# Patient Record
Sex: Male | Born: 1995 | Race: Black or African American | Hispanic: No | Marital: Single | Smoking: Former smoker
Health system: Southern US, Community
[De-identification: ages and names within clinical notes are randomized; demographics above are authoritative.]

---

## 2015-11-18 ENCOUNTER — Encounter (HOSPITAL_COMMUNITY): Payer: Self-pay

## 2015-11-18 ENCOUNTER — Emergency Department (HOSPITAL_COMMUNITY)
Admission: EM | Admit: 2015-11-18 | Discharge: 2015-11-19 | Disposition: A | Discharge status: Home / Self Care | Payer: Managed Care, Other (non HMO) | Attending: Emergency Medicine | Admitting: Emergency Medicine

## 2015-11-18 DIAGNOSIS — R42 Dizziness and giddiness: Secondary | ICD-10-CM | POA: Diagnosis not present

## 2015-11-18 DIAGNOSIS — R112 Nausea with vomiting, unspecified: Secondary | ICD-10-CM

## 2015-11-18 DIAGNOSIS — R52 Pain, unspecified: Secondary | ICD-10-CM

## 2015-11-18 DIAGNOSIS — R109 Unspecified abdominal pain: Secondary | ICD-10-CM | POA: Diagnosis not present

## 2015-11-18 MED ORDER — ONDANSETRON 4 MG PO TBDP
4.0000 mg | ORAL_TABLET | Freq: Once | ORAL | Status: AC | PRN
  Administered 2015-11-18: 4 mg via ORAL
  Filled 2015-11-18: qty 1

## 2015-11-18 NOTE — ED Notes (Signed)
Pt complains of vomiting and feeling lightheaded after eating chinese food today

## 2015-11-18 NOTE — ED Notes (Signed)
Pt states his nausea has improved but worsens when he drinks fluids. Pt has not had an episode of vomiting as of yet.

## 2015-11-18 NOTE — ED Provider Notes (Signed)
History  By signing my name below, I, Karle Plumber, attest that this documentation has been prepared under the direction and in the presence of Elpidio Anis, PA-C. Electronically Signed: Karle Plumber, ED Scribe. 11/18/2015. 11:21 PM.  Chief Complaint  Patient presents with  . Emesis   The history is provided by the patient and medical records. No language interpreter was used.    HPI Comments:  Corey Elliott is a 20 y.o. male who presents to the Emergency Department complaining of nausea that began about 7 hours ago and one episode of vomiting with dark red blood in it that occurred about one hour ago approximately 8 hours after eating [place] food. He reports associated sharp, mid left-sided epigastric abdominal pain. He rates his abdominal pain at 8/10. He reports associated light-headedness and dizziness. He has not taken anything to treat his symptoms. Walking around makes him feel worse. He denies alleviating factors. He denies fever, CP, SOB, diarrhea. Pt denies h/o abdominal surgeries. He denies allergies to any medications. He reports PMHx of GERD.  History reviewed. No pertinent past medical history. History reviewed. No pertinent past surgical history. History reviewed. No pertinent family history. Social History  Substance Use Topics  . Smoking status: Never Smoker   . Smokeless tobacco: None  . Alcohol Use: Yes    Review of Systems  Constitutional: Negative for fever.  Respiratory: Negative for shortness of breath.   Cardiovascular: Negative for chest pain.  Gastrointestinal: Positive for nausea, vomiting and abdominal pain.  Musculoskeletal: Negative for back pain.  Neurological: Positive for dizziness and light-headedness. Negative for syncope.  All other systems reviewed and are negative.   Allergies  Review of patient's allergies indicates no known allergies.  Home Medications   Prior to Admission medications   Not on File   Triage Vitals: BP 142/87  mmHg  Pulse 84  Temp(Src) 98.3 F (36.8 C) (Oral)  Resp 18  Ht  (1.702 m)  Wt 165 lb (74.844 kg)  BMI 25.84 kg/m2  SpO2 100% Physical Exam  Constitutional: He is oriented to person, place, and time. He appears well-developed and well-nourished.  HENT:  Head: Normocephalic and atraumatic.  Eyes: EOM are normal.  Neck: Normal range of motion.  Cardiovascular: Normal rate.   Pulmonary/Chest: Effort normal.  Abdominal: Soft. There is tenderness. There is no rebound.    Musculoskeletal: Normal range of motion.  Neurological: He is alert and oriented to person, place, and time.  Skin: Skin is warm and dry.  Psychiatric: He has a normal mood and affect. His behavior is normal.  Nursing note and vitals reviewed.   ED Course  Procedures (including critical care time) DIAGNOSTIC STUDIES: Oxygen Saturation is 100% on RA, normal by my interpretation.   COORDINATION OF CARE: 11:18 PM- Will order Zofran. Pt verbalizes understanding and agrees to plan.  Medications  ondansetron (ZOFRAN-ODT) disintegrating tablet 4 mg (4 mg Oral Given 11/18/15 2308)   Labs Review Labs Reviewed - No data to display  Imaging Review No results found. I have personally reviewed and evaluated these images and lab results as part of my medical decision-making.   EKG Interpretation None      MDM   Final diagnoses:  None    1. Abdominal pain   12:45 - the patient continues to be significantly tender on left abdomen, with nausea. No further vomiting. Will do labs, CT abd/pel.   2:45 - CT and labs are unremarkable for acute finding. He has had no further vomiting.  He is tolerating PO fluids. He can be discharged home with symptomatic treatment - Rx Reglan - and PCP follow up   I personally performed the services described in this documentation, which was scribed in my presence. The recorded information has been reviewed and is accurate.     Elpidio Anis, PA-C 11/19/15 1610  Linwood Dibbles, MD 11/19/15 [phone]

## 2015-11-19 ENCOUNTER — Emergency Department (HOSPITAL_COMMUNITY): Payer: Managed Care, Other (non HMO)

## 2015-11-19 LAB — CBC WITH DIFFERENTIAL/PLATELET
Basophils Absolute: 0 10*3/uL (ref 0.0–0.1)
Basophils Relative: 1 %
Eosinophils Absolute: 0.1 10*3/uL (ref 0.0–0.7)
Eosinophils Relative: 1 %
HEMATOCRIT: 43 % (ref 39.0–52.0)
HEMOGLOBIN: 13.9 g/dL (ref 13.0–17.0)
LYMPHS ABS: 3.3 10*3/uL (ref 0.7–4.0)
LYMPHS PCT: 55 %
MCH: 30.5 pg (ref 26.0–34.0)
MCHC: 32.3 g/dL (ref 30.0–36.0)
MCV: 94.5 fL (ref 78.0–100.0)
MONO ABS: 0.6 10*3/uL (ref 0.1–1.0)
MONOS PCT: 10 %
NEUTROS ABS: 2 10*3/uL (ref 1.7–7.7)
NEUTROS PCT: 33 %
Platelets: 358 10*3/uL (ref 150–400)
RBC: 4.55 MIL/uL (ref 4.22–5.81)
RDW: 13 % (ref 11.5–15.5)
WBC: 5.9 10*3/uL (ref 4.0–10.5)

## 2015-11-19 LAB — COMPREHENSIVE METABOLIC PANEL
ALBUMIN: 4.7 g/dL (ref 3.5–5.0)
ALT: 33 U/L (ref 17–63)
ANION GAP: 8 (ref 5–15)
AST: 38 U/L (ref 15–41)
Alkaline Phosphatase: 50 U/L (ref 38–126)
BILIRUBIN TOTAL: 0.8 mg/dL (ref 0.3–1.2)
BUN: 13 mg/dL (ref 6–20)
CO2: 26 mmol/L (ref 22–32)
Calcium: 9.5 mg/dL (ref 8.9–10.3)
Chloride: 105 mmol/L (ref 101–111)
Creatinine, Ser: 0.84 mg/dL (ref 0.61–1.24)
GFR calc Af Amer: >60 mL/min (ref >60)
GFR calc non Af Amer: >60 mL/min (ref >60)
GLUCOSE: 112 mg/dL — AB (ref 65–99)
POTASSIUM: 4.2 mmol/L (ref 3.5–5.1)
SODIUM: 139 mmol/L (ref 135–145)
TOTAL PROTEIN: 7.6 g/dL (ref 6.5–8.1)

## 2015-11-19 LAB — LIPASE, BLOOD: Lipase: 28 U/L (ref 11–51)

## 2015-11-19 MED ORDER — METOCLOPRAMIDE HCL 10 MG PO TABS: 10.0000 mg | ORAL_TABLET | Freq: Four times a day (QID) | ORAL | Status: DC

## 2015-11-19 MED ORDER — METOCLOPRAMIDE HCL 5 MG/ML IJ SOLN
10.0000 mg | Freq: Once | INTRAMUSCULAR | Status: AC
  Administered 2015-11-19: 10 mg via INTRAVENOUS
  Filled 2015-11-19: qty 2

## 2015-11-19 MED ORDER — IOHEXOL 300 MG/ML  SOLN
50.0000 mL | Freq: Once | INTRAMUSCULAR | Status: AC | PRN
  Administered 2015-11-19: 50 mL via ORAL

## 2015-11-19 MED ORDER — IOHEXOL 300 MG/ML  SOLN
100.0000 mL | Freq: Once | INTRAMUSCULAR | Status: AC | PRN
  Administered 2015-11-19: 100 mL via INTRAVENOUS

## 2015-11-19 MED ORDER — SODIUM CHLORIDE 0.9 % IV BOLUS (SEPSIS)
1000.0000 mL | Freq: Once | INTRAVENOUS | Status: AC
  Administered 2015-11-19: 1000 mL via INTRAVENOUS

## 2015-11-19 NOTE — ED Notes (Signed)
Pt states nausea has resolved

## 2015-11-19 NOTE — Discharge Instructions (Signed)

## 2016-01-06 ENCOUNTER — Ambulatory Visit: Payer: Managed Care, Other (non HMO)

## 2016-01-07 ENCOUNTER — Encounter: Payer: Self-pay | Admitting: Internal Medicine

## 2016-01-07 ENCOUNTER — Ambulatory Visit (INDEPENDENT_AMBULATORY_CARE_PROVIDER_SITE_OTHER): Payer: Managed Care, Other (non HMO) | Admitting: Internal Medicine

## 2016-01-07 VITALS — BP 120/76 | HR 67 | Temp 97.6°F | Resp 12 | Ht 67.0 in | Wt 170.0 lb

## 2016-01-07 DIAGNOSIS — Z0000 Encounter for general adult medical examination without abnormal findings: Secondary | ICD-10-CM

## 2016-01-07 NOTE — Patient Instructions (Signed)
     IF you received an x-ray today, you will receive an invoice from [facility]. Please contact [facility] at [phone] with questions or concerns regarding your invoice.   IF you received labwork today, you will receive an invoice from [facility]. Please contact [facility] at [phone] with questions or concerns regarding your invoice.   Our billing staff will not be able to assist you with questions regarding bills from these companies.  You will be contacted with the lab results as soon as they are available. The fastest way to get your results is to activate your My Chart account. Instructions are located on the last page of this paperwork. If you have not heard from us regarding the results in 2 weeks, please contact this office.      

## 2016-01-07 NOTE — Progress Notes (Addendum)
Subjective:  By signing my name below, I, Stann Ore, attest that this documentation has been prepared under the direction and in the presence of Ellamae Sia, MD. Electronically Signed: Stann Ore, Scribe. 01/07/2016 , 12:08 PM .  Patient was seen in Room 14 .   Patient ID: Corey Elliott, male    DOB: 16-Mar-1996, 20 y.o.   MRN: [id number] Chief Complaint  Patient presents with  . Annual Exam   HPI!st OV at [facility] Corey Elliott is a 20 y.o. 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  He denies chronic illnesses. He exercises regularly. He denies allergies. He denies any major injuries from sports. He denies vision problems. He is a current smoker; he plans to quit soon. Limited ETOH.   Immunizations He received immunizations when he attended college when he was 16 (3 years ago).   He is on no meds  Active Ambulatory Problems    Diagnosis Date Noted  . No Active Ambulatory Problems   Resolved Ambulatory Problems    Diagnosis Date Noted  . No Resolved Ambulatory Problems   History reviewed. No pertinent past surgical history. Social History   Social History  . Marital Status: Single   Social History Main Topics  . Smoking status: Current Every Day Smoker -- 1.00 packs/day for 2 years    Types: Cigarettes  . Smokeless tobacco: Not on file  . Alcohol Use: 0.0 oz/week    0 Standard drinks or equivalent per week  . Drug Use: No    Review of Systems  Constitutional: Negative for fever, chills, activity change, appetite change and fatigue.  HENT: Negative for dental problem, hearing loss and trouble swallowing.   Eyes: Negative for photophobia, pain and visual disturbance.  Respiratory: Negative for cough, choking, shortness of breath and wheezing.   Cardiovascular: Negative for chest pain, palpitations and leg swelling.  Gastrointestinal: Negative for nausea, vomiting, abdominal pain, diarrhea and  constipation.  Genitourinary: Negative for urgency, frequency, difficulty urinating and testicular pain.  Musculoskeletal: Negative for myalgias, back pain, joint swelling, arthralgias, gait problem, neck pain and neck stiffness.  Skin: Negative for rash and wound.  Neurological: Negative for dizziness, speech difficulty, weakness, light-headedness and headaches.  Hematological: Negative for adenopathy. Does not bruise/bleed easily.  Psychiatric/Behavioral: Negative for behavioral problems, sleep disturbance, self-injury, dysphoric mood and decreased concentration. The patient is not nervous/anxious.       Objective:   Physical Exam  Constitutional: He is oriented to person, place, and time. He appears well-developed and well-nourished. No distress.  HENT:  Head: Normocephalic and atraumatic.  Right Ear: Hearing, tympanic membrane, external ear and ear canal normal.  Left Ear: Hearing, tympanic membrane, external ear and ear canal normal.  Nose: Nose normal.  Mouth/Throat: Uvula is midline, oropharynx is clear and moist and mucous membranes are normal.  Eyes: Conjunctivae, EOM and lids are normal. Pupils are equal, round, and reactive to light. Right eye exhibits no discharge. Left eye exhibits no discharge. No scleral icterus.  Neck: Trachea normal and normal range of motion. Neck supple. Carotid bruit is not present.  Cardiovascular: Normal rate, regular rhythm, normal heart sounds, intact distal pulses and normal pulses.   No murmur heard. Pulmonary/Chest: Effort normal and breath sounds normal. No respiratory distress. He has no wheezes. He has no rhonchi. He has no rales.  Abdominal: Soft. Normal appearance and bowel sounds are normal. He exhibits no abdominal bruit. There is no hepatosplenomegaly.  There is no tenderness. There is no CVA tenderness.  Musculoskeletal: Normal range of motion. He exhibits no edema or tenderness.  Lymphadenopathy:       Head (right side): No submental, no  submandibular, no tonsillar, no preauricular, no posterior auricular and no occipital adenopathy present.       Head (left side): No submental, no submandibular, no tonsillar, no preauricular, no posterior auricular and no occipital adenopathy present.    He has no cervical adenopathy.  Neurological: He is alert and oriented to person, place, and time. He has normal strength and normal reflexes. No cranial nerve deficit or sensory deficit. Coordination and gait normal.  Skin: Skin is warm, dry and intact. No lesion and no rash noted.  Psychiatric: He has a normal mood and affect. His speech is normal and behavior is normal. Judgment and thought content normal.  Nursing note and vitals reviewed.  BP 120/76 mmHg  Pulse 67  Temp(Src) 97.6 F (36.4 C) (Oral)  Resp 12  Ht 5\' 7"  (1.702 m)  Wt 170 lb (77.111 kg)  BMI 26.62 kg/m2  SpO2 99%    Last labs Results for orders placed or performed during the hospital encounter of 11/18/15  Comprehensive metabolic panel  Result Value Ref Range   Sodium 139 135 - 145 mmol/L   Potassium 4.2 3.5 - 5.1 mmol/L   Chloride 105 101 - 111 mmol/L   CO2 26 22 - 32 mmol/L   Glucose, Bld 112 (H) 65 - 99 mg/dL   BUN 13 6 - 20 mg/dL   Creatinine, Ser 1.610.84 0.61 - 1.24 mg/dL   Calcium 9.5 8.9 - 09.610.3 mg/dL   Total Protein 7.6 6.5 - 8.1 g/dL   Albumin 4.7 3.5 - 5.0 g/dL   AST 38 15 - 41 U/L   ALT 33 17 - 63 U/L   Alkaline Phosphatase 50 38 - 126 U/L   Total Bilirubin 0.8 0.3 - 1.2 mg/dL   GFR calc non Af Amer >60 >60 mL/min   GFR calc Af Amer >60 >60 mL/min   Anion gap 8 5 - 15  Lipase, blood  Result Value Ref Range   Lipase 28 11 - 51 U/L  CBC with Differential  Result Value Ref Range   WBC 5.9 4.0 - 10.5 K/uL   RBC 4.55 4.22 - 5.81 MIL/uL   Hemoglobin 13.9 13.0 - 17.0 g/dL   HCT 04.543.0 40.939.0 - 81.152.0 %   MCV 94.5 78.0 - 100.0 fL   MCH 30.5 26.0 - 34.0 pg   MCHC 32.3 30.0 - 36.0 g/dL   RDW 91.413.0 78.211.5 - 95.615.5 %   Platelets 358 150 - 400 K/uL   Neutrophils  Relative % 33 %   Neutro Abs 2.0 1.7 - 7.7 K/uL   Lymphocytes Relative 55 %   Lymphs Abs 3.3 0.7 - 4.0 K/uL   Monocytes Relative 10 %   Monocytes Absolute 0.6 0.1 - 1.0 K/uL   Eosinophils Relative 1 %   Eosinophils Absolute 0.1 0.0 - 0.7 K/uL   Basophils Relative 1 %   Basophils Absolute 0.0 0.0 - 0.1 K/uL    Assessment & Plan:  Annual Physical Exam-healthy  Immunizations reported as up to date  No need to recheck unless problems for next 2 years  I have completed the patient encounter in its entirety as documented by the scribe, with editing by me where necessary. Davanna He P. Merla Richesoolittle, M.D.

## 2016-08-31 IMAGING — CT CT ABD-PELV W/ CM
2 of 4 series · 16 of 46 positions shown, 18 images · IV contrast (omnipaque)
Comparison: None.

CLINICAL DATA: Mid left-sided epigastric abdominal pain. Vomiting
today.

EXAM:
CT ABDOMEN AND PELVIS WITH CONTRAST
TECHNIQUE: Multidetector CT imaging of the abdomen and pelvis was performed
using the standard protocol following bolus administration of
intravenous contrast.
CONTRAST:  50mL OMNIPAQUE IOHEXOL 300 MG/ML SOLN, 100mL OMNIPAQUE
IOHEXOL 300 MG/ML SOLN

[Series 2: abd/pel with · axial · 0.72mm/px · z∈[-442,-47]mm · 13 of 87 slices shown, 15 images]
[im 4/87  soft-tissue]
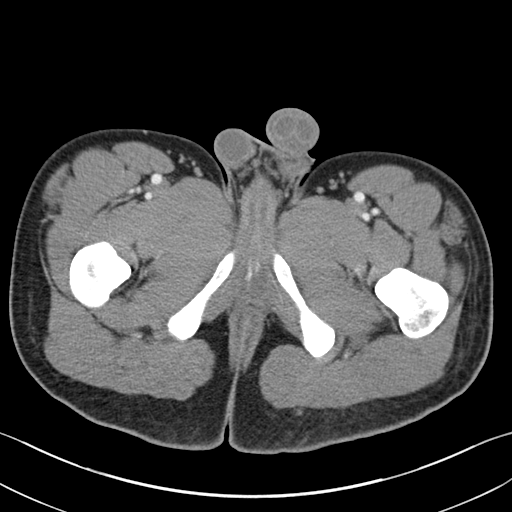
[im 4/87  bone]
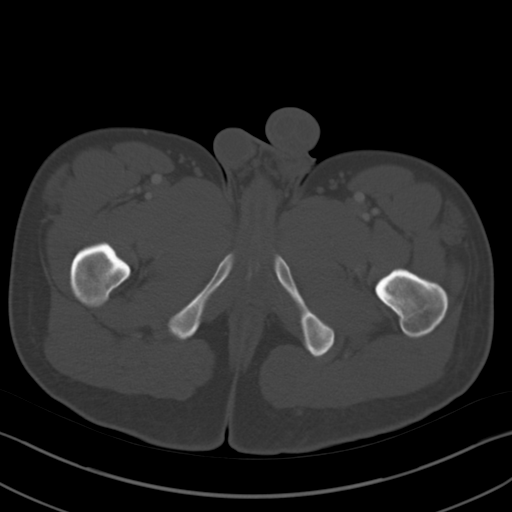
[im 12/87  soft-tissue]
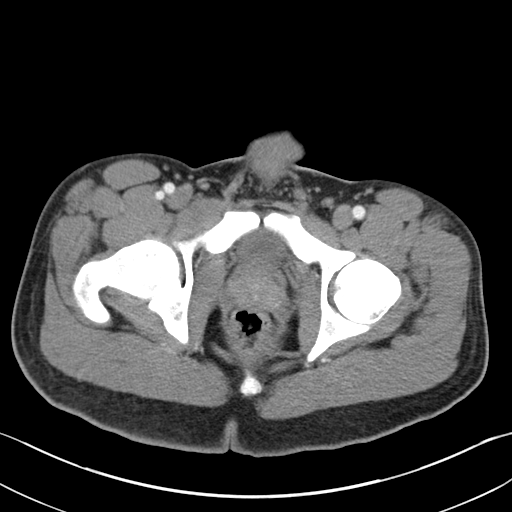
[im 19/87  soft-tissue]
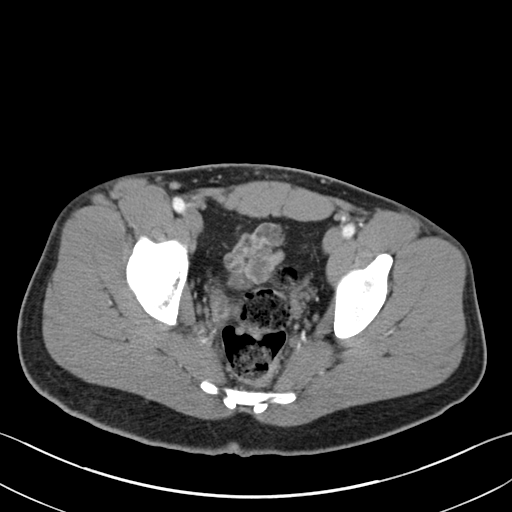
[im 23/87  soft-tissue]
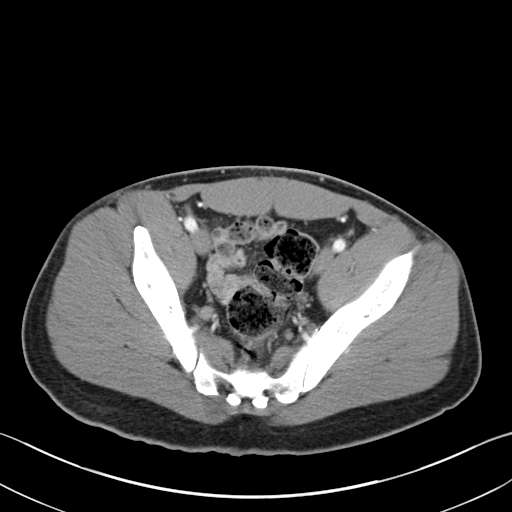
[im 30/87  soft-tissue]
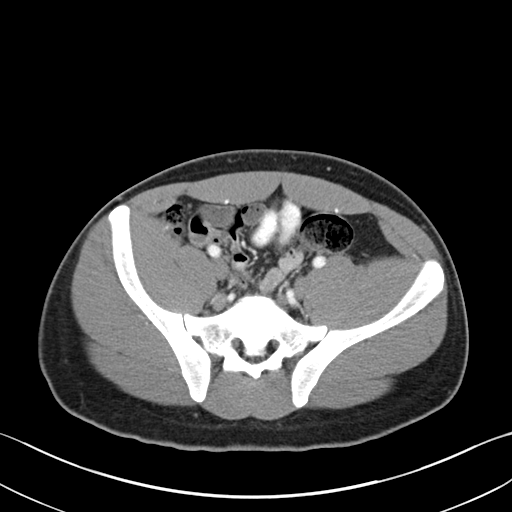
[im 38/87  soft-tissue]
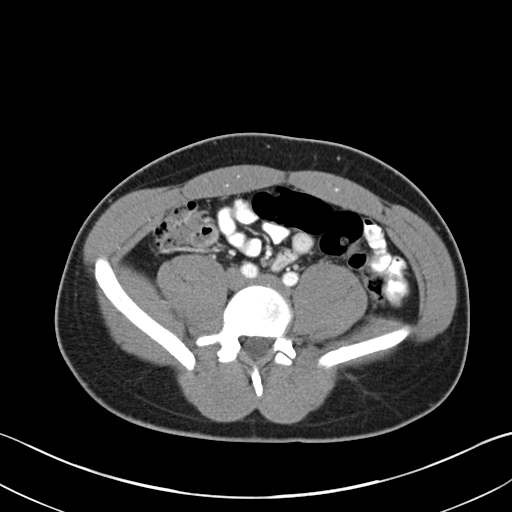
[im 45/87  soft-tissue]
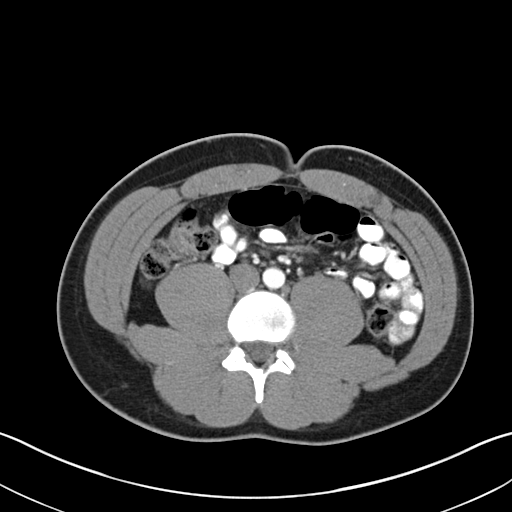
[im 49/87  soft-tissue]
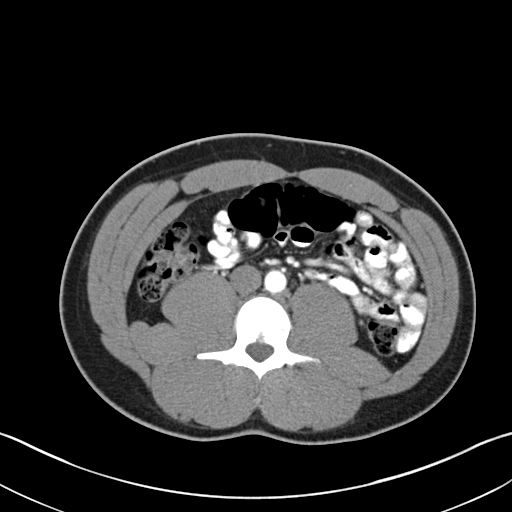
[im 57/87  soft-tissue]
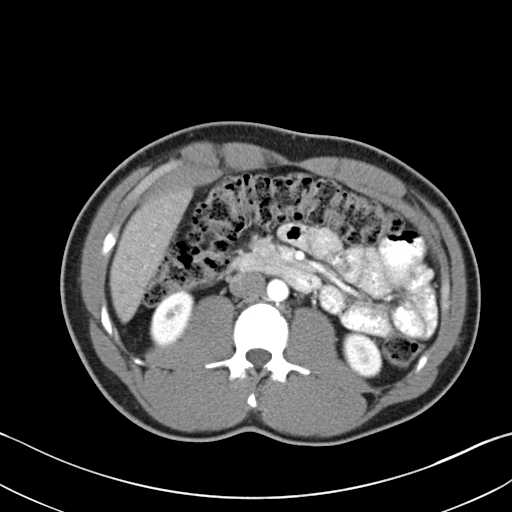
[im 57/87  bone]
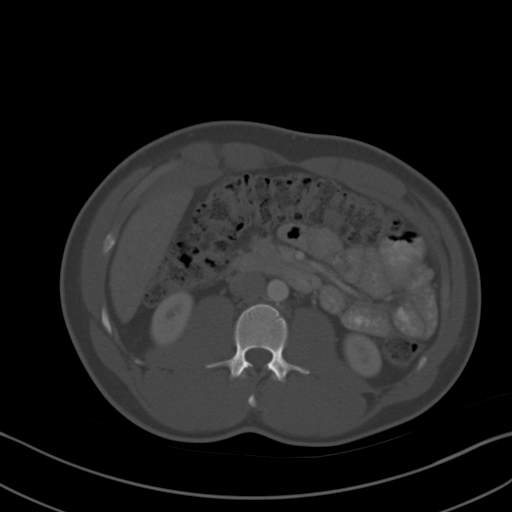
[im 64/87  soft-tissue]
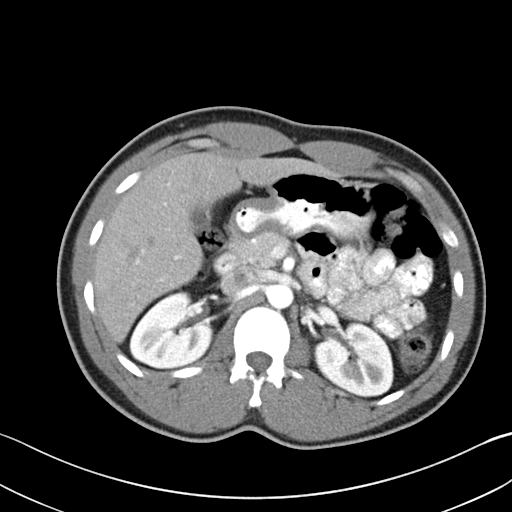
[im 68/87  soft-tissue]
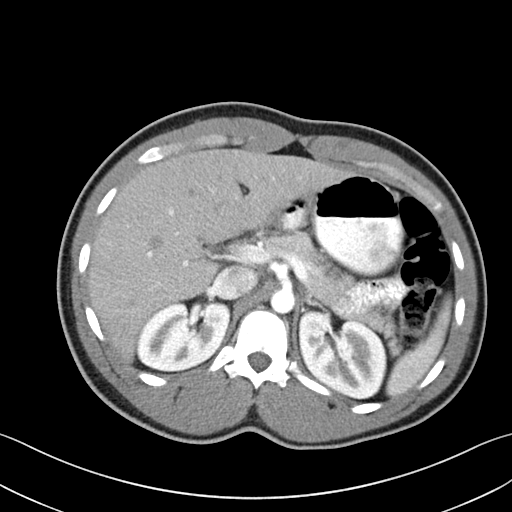
[im 75/87  soft-tissue]
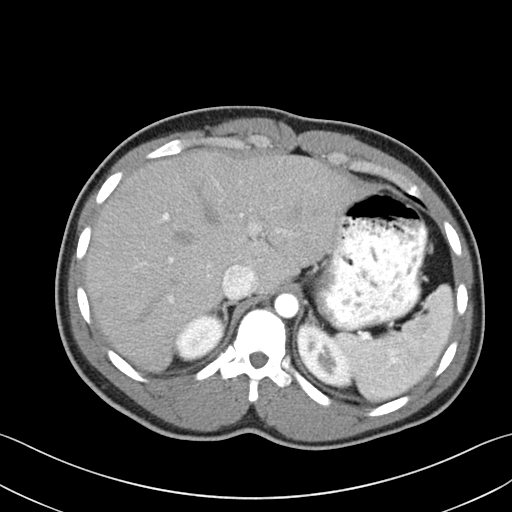
[im 83/87  soft-tissue]
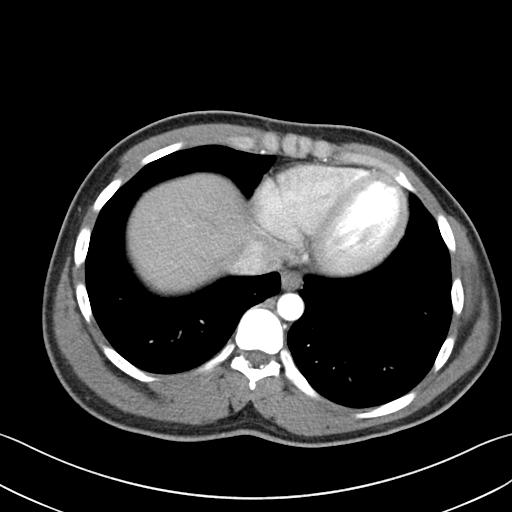

[Series 5: coronal a/|p · coronal · 0.71mm/px · 3 of 85 slices shown]
[im 29/85  soft-tissue]
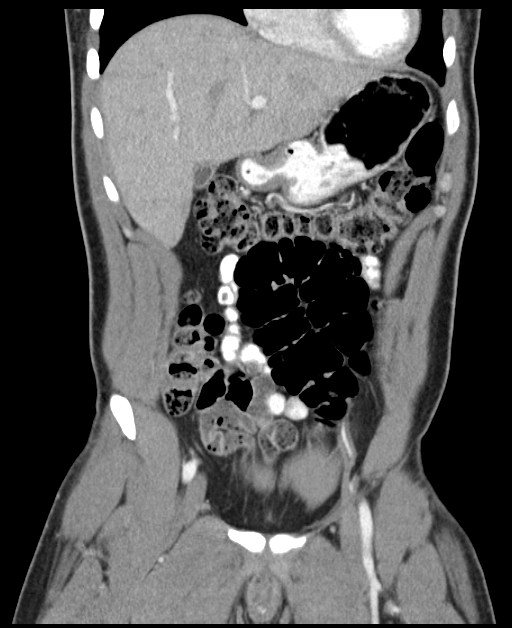
[im 38/85  soft-tissue]
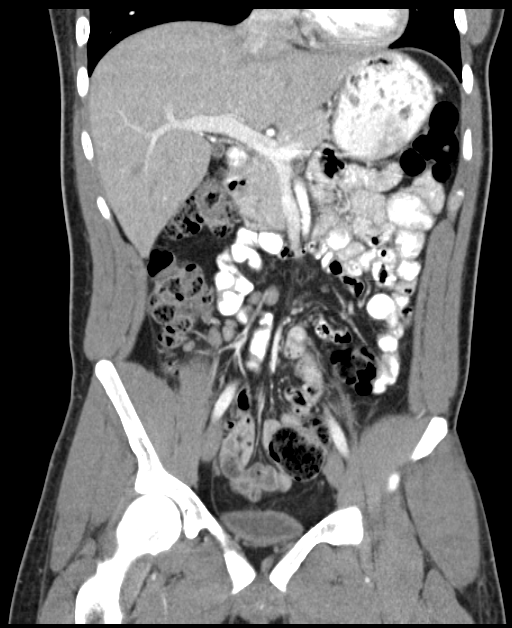
[im 47/85  soft-tissue]
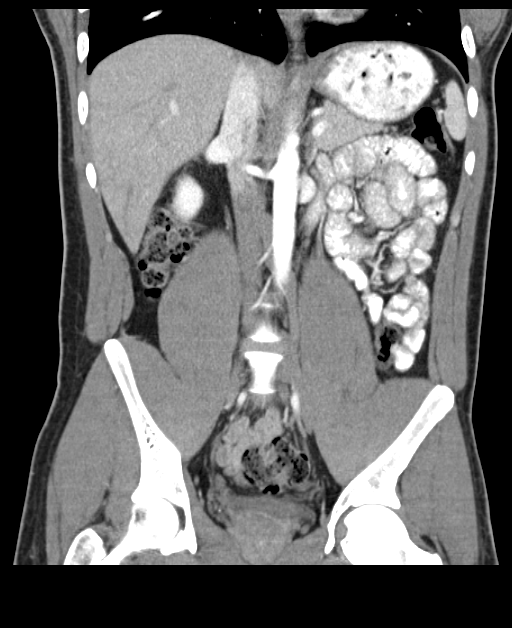

[16 of 46 positions shown; findings below may reference images not displayed]

FINDINGS: Funny bases are clear.

The liver, spleen, gallbladder, pancreas, adrenal glands, kidneys,
abdominal aorta, inferior vena cava, and retroperitoneal lymph nodes
are unremarkable. Stomach, small bowel, and colon appear normal.
Stool fills the colon. No free air or free fluid in the abdomen.
Abdominal wall musculature appears intact.

Pelvis: Prostate gland is normal in size but seminal vesicles are
somewhat prominent. Bladder is decompressed. No free or loculated
pelvic fluid collections. No pelvic mass or lymphadenopathy. No
evidence of diverticulitis. The appendix is normal. No destructive
bone lesions.
IMPRESSION: No acute process demonstrated in the abdomen or pelvis. No evidence
of bowel obstruction or inflammation. Seminal vesicles are somewhat
prominent. Normal size prostate.

## 2019-12-05 ENCOUNTER — Other Ambulatory Visit: Payer: Self-pay

## 2019-12-05 ENCOUNTER — Encounter: Payer: Self-pay | Admitting: Emergency Medicine

## 2019-12-05 ENCOUNTER — Ambulatory Visit
Admission: EM | Admit: 2019-12-05 | Discharge: 2019-12-05 | Disposition: A | Discharge status: Home / Self Care | Payer: BC Managed Care – PPO | Attending: Emergency Medicine | Admitting: Emergency Medicine

## 2019-12-05 DIAGNOSIS — R0789 Other chest pain: Secondary | ICD-10-CM | POA: Diagnosis not present

## 2019-12-05 MED ORDER — CYCLOBENZAPRINE HCL 5 MG PO TABS: 5.0000 mg | ORAL_TABLET | Freq: Two times a day (BID) | ORAL | 0 refills | Status: AC | PRN

## 2019-12-05 MED ORDER — NAPROXEN 500 MG PO TABS: 500.0000 mg | ORAL_TABLET | Freq: Two times a day (BID) | ORAL | 0 refills | Status: AC

## 2019-12-05 NOTE — ED Notes (Signed)
Patient able to ambulate independently  

## 2019-12-05 NOTE — Discharge Instructions (Addendum)
Recommend RICE: rest, ice, compression, elevation as needed for pain.   Heat therapy (hot compress, warm wash red, hot showers, etc.) can help relax muscles and soothe muscle aches. Cold therapy (ice packs) can be used to help swelling both after injury and after prolonged use of areas of chronic pain/aches.  For pain: take naproxen as directed, may supplement with tylenol as needed  May take muscle relaxer as needed for severe pain / spasm.  (This medication may cause you to become tired so it is important you do not drink alcohol or operate heavy machinery while on this medication.  Recommend your first dose to be taken before bedtime to monitor for side effects safely)  Return for worsening pain, chest pain, difficulty breathing, vomiting.
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
